# Patient Record
Sex: Male | Born: 1965 | Race: White | Hispanic: No | Marital: Married | Smoking: Never smoker
Health system: Southern US, Community
[De-identification: ages and names within clinical notes are randomized; demographics above are authoritative.]

## PROBLEM LIST (undated history)

## (undated) ENCOUNTER — Emergency Department (HOSPITAL_COMMUNITY): Admission: EM | Payer: Self-pay | Source: Home / Self Care

## (undated) DIAGNOSIS — T8859XA Other complications of anesthesia, initial encounter: Secondary | ICD-10-CM

## (undated) DIAGNOSIS — K649 Unspecified hemorrhoids: Secondary | ICD-10-CM

## (undated) DIAGNOSIS — T4145XA Adverse effect of unspecified anesthetic, initial encounter: Secondary | ICD-10-CM

## (undated) DIAGNOSIS — R112 Nausea with vomiting, unspecified: Secondary | ICD-10-CM

## (undated) DIAGNOSIS — Z9889 Other specified postprocedural states: Secondary | ICD-10-CM

## (undated) DIAGNOSIS — Z8719 Personal history of other diseases of the digestive system: Secondary | ICD-10-CM

## (undated) HISTORY — DX: Unspecified hemorrhoids: K64.9

## (undated) HISTORY — PX: OTHER SURGICAL HISTORY: SHX169

## (undated) HISTORY — PX: CARPAL TUNNEL RELEASE: SHX101

## (undated) HISTORY — PX: TONSILLECTOMY: SUR1361

## (undated) HISTORY — DX: Personal history of other diseases of the digestive system: Z87.19

## (undated) HISTORY — PX: APPENDECTOMY: SHX54

---

## 1999-12-20 ENCOUNTER — Ambulatory Visit (HOSPITAL_COMMUNITY): Admission: RE | Admit: 1999-12-20 | Discharge: 1999-12-20 | Payer: Self-pay | Admitting: Orthopedic Surgery

## 1999-12-20 ENCOUNTER — Encounter: Payer: Self-pay | Admitting: Orthopedic Surgery

## 2000-12-18 ENCOUNTER — Ambulatory Visit (HOSPITAL_BASED_OUTPATIENT_CLINIC_OR_DEPARTMENT_OTHER): Admission: RE | Admit: 2000-12-18 | Discharge: 2000-12-18 | Payer: Self-pay | Admitting: General Surgery

## 2002-03-06 ENCOUNTER — Ambulatory Visit (HOSPITAL_COMMUNITY): Admission: RE | Admit: 2002-03-06 | Discharge: 2002-03-06 | Payer: Self-pay | Admitting: Orthopedic Surgery

## 2002-03-06 ENCOUNTER — Encounter: Payer: Self-pay | Admitting: Orthopedic Surgery

## 2003-07-19 ENCOUNTER — Ambulatory Visit (HOSPITAL_COMMUNITY): Admission: RE | Admit: 2003-07-19 | Discharge: 2003-07-19 | Payer: Self-pay | Admitting: Orthopedic Surgery

## 2004-05-03 ENCOUNTER — Emergency Department: Payer: Self-pay | Admitting: Internal Medicine

## 2004-05-22 ENCOUNTER — Ambulatory Visit (HOSPITAL_COMMUNITY): Admission: RE | Admit: 2004-05-22 | Discharge: 2004-05-22 | Payer: Self-pay | Admitting: Orthopedic Surgery

## 2006-03-10 ENCOUNTER — Emergency Department (HOSPITAL_COMMUNITY): Admission: EM | Admit: 2006-03-10 | Discharge: 2006-03-10 | Payer: Self-pay | Admitting: Emergency Medicine

## 2006-03-13 ENCOUNTER — Ambulatory Visit (HOSPITAL_BASED_OUTPATIENT_CLINIC_OR_DEPARTMENT_OTHER): Admission: RE | Admit: 2006-03-13 | Discharge: 2006-03-13 | Payer: Self-pay | Admitting: Orthopedic Surgery

## 2006-10-06 ENCOUNTER — Emergency Department (HOSPITAL_COMMUNITY): Admission: EM | Admit: 2006-10-06 | Discharge: 2006-10-07 | Payer: Self-pay | Admitting: Emergency Medicine

## 2010-11-22 ENCOUNTER — Other Ambulatory Visit (HOSPITAL_COMMUNITY): Payer: Self-pay | Admitting: Orthopedic Surgery

## 2010-11-22 ENCOUNTER — Ambulatory Visit (HOSPITAL_COMMUNITY)
Admission: RE | Admit: 2010-11-22 | Discharge: 2010-11-22 | Disposition: A | Discharge status: Home / Self Care | Payer: BC Managed Care – PPO | Source: Ambulatory Visit | Attending: Orthopedic Surgery | Admitting: Orthopedic Surgery

## 2010-11-22 DIAGNOSIS — Z0389 Encounter for observation for other suspected diseases and conditions ruled out: Secondary | ICD-10-CM | POA: Insufficient documentation

## 2010-11-22 DIAGNOSIS — Z1389 Encounter for screening for other disorder: Secondary | ICD-10-CM

## 2011-07-22 HISTORY — PX: WRIST SURGERY: SHX841

## 2014-09-25 ENCOUNTER — Other Ambulatory Visit (HOSPITAL_COMMUNITY): Payer: Self-pay | Admitting: Orthopedic Surgery

## 2014-09-25 DIAGNOSIS — M25522 Pain in left elbow: Secondary | ICD-10-CM

## 2014-09-26 ENCOUNTER — Ambulatory Visit (HOSPITAL_COMMUNITY): Admission: RE | Admit: 2014-09-26 | Payer: Commercial Managed Care - PPO | Source: Ambulatory Visit

## 2015-07-22 HISTORY — PX: WRIST SURGERY: SHX841

## 2016-05-13 ENCOUNTER — Encounter (HOSPITAL_COMMUNITY): Payer: Self-pay | Admitting: Emergency Medicine

## 2016-05-13 ENCOUNTER — Emergency Department (HOSPITAL_COMMUNITY)
Admission: EM | Admit: 2016-05-13 | Discharge: 2016-05-13 | Disposition: A | Discharge status: Home / Self Care | Payer: Worker's Compensation | Attending: Emergency Medicine | Admitting: Emergency Medicine

## 2016-05-13 ENCOUNTER — Emergency Department (HOSPITAL_COMMUNITY): Payer: Worker's Compensation

## 2016-05-13 DIAGNOSIS — S52501A Unspecified fracture of the lower end of right radius, initial encounter for closed fracture: Secondary | ICD-10-CM | POA: Diagnosis not present

## 2016-05-13 DIAGNOSIS — S52614A Nondisplaced fracture of right ulna styloid process, initial encounter for closed fracture: Secondary | ICD-10-CM | POA: Diagnosis not present

## 2016-05-13 DIAGNOSIS — Y939 Activity, unspecified: Secondary | ICD-10-CM | POA: Diagnosis not present

## 2016-05-13 DIAGNOSIS — S59911A Unspecified injury of right forearm, initial encounter: Secondary | ICD-10-CM | POA: Diagnosis present

## 2016-05-13 DIAGNOSIS — Y929 Unspecified place or not applicable: Secondary | ICD-10-CM | POA: Insufficient documentation

## 2016-05-13 DIAGNOSIS — Y990 Civilian activity done for income or pay: Secondary | ICD-10-CM | POA: Diagnosis not present

## 2016-05-13 DIAGNOSIS — W010XXA Fall on same level from slipping, tripping and stumbling without subsequent striking against object, initial encounter: Secondary | ICD-10-CM | POA: Insufficient documentation

## 2016-05-13 MED ORDER — MORPHINE SULFATE (PF) 4 MG/ML IV SOLN
4.0000 mg | Freq: Once | INTRAVENOUS | Status: AC
  Administered 2016-05-13: 4 mg via INTRAVENOUS
  Filled 2016-05-13: qty 1

## 2016-05-13 MED ORDER — OXYCODONE-ACETAMINOPHEN 5-325 MG PO TABS
2.0000 | ORAL_TABLET | Freq: Once | ORAL | Status: AC
  Administered 2016-05-13: 2 via ORAL
  Filled 2016-05-13: qty 2

## 2016-05-13 MED ORDER — OXYCODONE-ACETAMINOPHEN 5-325 MG PO TABS: 1.0000 | ORAL_TABLET | ORAL | 0 refills | Status: DC | PRN

## 2016-05-13 MED ORDER — PROPOFOL 10 MG/ML IV BOLUS: 1.0000 mg/kg | Freq: Once | INTRAVENOUS | Status: DC

## 2016-05-13 NOTE — ED Provider Notes (Signed)
[facility] DEPT Provider Note   CSN: [id number] Arrival date & time: 05/13/16  0502     History   Chief Complaint Chief Complaint  Patient presents with  . Arm Injury    HPI Adrian Mcgee is a 50 y.o. male.  HPI  History reviewed. No pertinent past medical history.  There are no active problems to display for this patient.  This a 50 year old male who presents with right wrist pain. Reports that he fell while at work and landed on his right arm. He is right-handed. Denies any tingling or weakness of the hand. Rates pain at 9 out of 10. Has not taken anything for the pain.  Past Surgical History:  Procedure Laterality Date  . APPENDECTOMY    . TONSILLECTOMY         Home Medications    Prior to Admission medications   Medication Sig Start Date End Date Taking? Authorizing Provider  OVER THE COUNTER MEDICATION Take 6 capsules by mouth daily.   Yes Historical Provider, MD  oxyCODONE-acetaminophen (PERCOCET/ROXICET) 5-325 MG tablet Take 1-2 tablets by mouth every 4 (four) hours as needed for severe pain. 05/13/16   Merryl Hacker, MD    Family History History reviewed. No pertinent family history.  Social History Social History  Substance Use Topics  . Smoking status: Never Smoker  . Smokeless tobacco: Current User    Types: Chew  . Alcohol use Yes     Comment: rare     Allergies   Review of patient's allergies indicates no known allergies.   Review of Systems Review of Systems  Constitutional: Negative.  Negative for fever.  Musculoskeletal: Negative for back pain.       Right wrist pain  Skin: Negative for wound.  All other systems reviewed and are negative.    Physical Exam Updated Vital Signs BP 170/84 (BP Location: Left Arm)   Pulse 69   Temp 97.6 F (36.4 C) (Oral)   Resp 18   Ht 5\' 9"  (1.753 m)   Wt 180 lb (81.6 kg)   SpO2 100%   BMI 26.58 kg/m   Physical Exam  Constitutional: He is oriented to person, place, and time. He  appears well-developed and well-nourished.  Cardiovascular: Normal rate and regular rhythm.   Pulmonary/Chest: Effort normal. No respiratory distress.  Musculoskeletal: He exhibits no edema.  Deformity noted right distal forearm, 2+ radial pulse, neurovascularly intact distally  Neurological: He is alert and oriented to person, place, and time.  Skin: Skin is warm and dry.  Psychiatric: He has a normal mood and affect.  Nursing note and vitals reviewed.    ED Treatments / Results  Labs (all labs ordered are listed, but only abnormal results are displayed) Labs Reviewed - No data to display  EKG  EKG Interpretation None       Radiology Dg Wrist Complete Right  Result Date: 05/13/2016 CLINICAL DATA:  Slipped and fell on LEFT floor. EXAM: RIGHT WRIST - COMPLETE 3+ VIEW COMPARISON:  None available for comparison at time of study interpretation. FINDINGS: Acute transverse fracture through the distal radial metaphysis with slight dorsal angulation of the distal bony fragments. No intra-articular extension. Nondisplaced ulnar styloid fracture. No destructive bony lesions. Dorsal wrist soft tissue swelling without subcutaneous gas or radiopaque foreign bodies. IMPRESSION: Acute mildly displaced distal radial fracture. Nondisplaced ulnar styloid fracture. No dislocation. Electronically Signed   By: Elon Alas M.D.   On: 05/13/2016 05:50    Procedures Procedures (including critical  care time)  Medications Ordered in ED Medications  morphine 4 MG/ML injection 4 mg (4 mg Intravenous Given 05/13/16 0526)  oxyCODONE-acetaminophen (PERCOCET/ROXICET) 5-325 MG per tablet 2 tablet (2 tablets Oral Given 05/13/16 0707)  morphine 4 MG/ML injection 4 mg (4 mg Intravenous Given 05/13/16 0707)     Initial Impression / Assessment and Plan / ED Course  I have reviewed the triage vital signs and the nursing notes.  Pertinent labs & imaging results that were available during my care of the  patient were reviewed by me and considered in my medical decision making (see chart for details).  Clinical Course    Patient presents with right arm injury. Otherwise nontoxic and neurovascular intact. Minimally displaced distal radius fracture as well as ulnar styloid fracture. Patient placed in a sugar tong splint. Follow-up with hand surgery.  After history, exam, and medical workup I feel the patient has been appropriately medically screened and is safe for discharge home. Pertinent diagnoses were discussed with the patient. Patient was given return precautions.   Final Clinical Impressions(s) / ED Diagnoses   Final diagnoses:  Closed fracture of distal end of right radius, unspecified fracture morphology, initial encounter  Closed nondisplaced fracture of styloid process of right ulna, initial encounter    New Prescriptions New Prescriptions   OXYCODONE-ACETAMINOPHEN (PERCOCET/ROXICET) 5-325 MG TABLET    Take 1-2 tablets by mouth every 4 (four) hours as needed for severe pain.     Merryl Hacker, MD 05/13/16 [phone]

## 2016-05-13 NOTE — Discharge Instructions (Signed)
You were seen today for arm pain. You have a fracture of the radius and ulna in the right arm. Maintain splint until follow-up with orthopedist. Follow-up in one week with orthopedist of her choice. You were given follow-up information for Dr. Lenon Curt as he is the hand surgeon on call.

## 2016-05-13 NOTE — ED Triage Notes (Signed)
Pt states he was trying to move a floor scrubber and his feet slipped out from under him and he fell landing on his right arm

## 2016-05-13 NOTE — ED Notes (Signed)
Family at bedside. 

## 2016-05-13 NOTE — ED Notes (Signed)
Pt. returned from XR. 

## 2016-05-13 NOTE — ED Notes (Signed)
Dr. Horton at bedside. 

## 2016-05-13 NOTE — ED Notes (Signed)
Report given to Crystal, RN.

## 2016-05-13 NOTE — ED Notes (Signed)
Ortho tech at bedside to place splint.

## 2016-05-13 NOTE — ED Notes (Signed)
Pt to XR

## 2016-12-03 DIAGNOSIS — G5601 Carpal tunnel syndrome, right upper limb: Secondary | ICD-10-CM | POA: Insufficient documentation

## 2016-12-03 DIAGNOSIS — M65341 Trigger finger, right ring finger: Secondary | ICD-10-CM | POA: Insufficient documentation

## 2016-12-03 DIAGNOSIS — S52501A Unspecified fracture of the lower end of right radius, initial encounter for closed fracture: Secondary | ICD-10-CM | POA: Insufficient documentation

## 2017-03-20 ENCOUNTER — Other Ambulatory Visit: Payer: Self-pay | Admitting: Orthopedic Surgery

## 2017-03-25 ENCOUNTER — Encounter (HOSPITAL_BASED_OUTPATIENT_CLINIC_OR_DEPARTMENT_OTHER): Payer: Self-pay | Admitting: *Deleted

## 2017-03-31 ENCOUNTER — Encounter (HOSPITAL_BASED_OUTPATIENT_CLINIC_OR_DEPARTMENT_OTHER): Payer: Self-pay | Admitting: Anesthesiology

## 2017-03-31 ENCOUNTER — Ambulatory Visit (HOSPITAL_BASED_OUTPATIENT_CLINIC_OR_DEPARTMENT_OTHER)
Admission: RE | Admit: 2017-03-31 | Discharge: 2017-03-31 | Disposition: A | Discharge status: Home / Self Care | Payer: Worker's Compensation | Source: Ambulatory Visit | Attending: Orthopedic Surgery | Admitting: Orthopedic Surgery

## 2017-03-31 ENCOUNTER — Ambulatory Visit (HOSPITAL_BASED_OUTPATIENT_CLINIC_OR_DEPARTMENT_OTHER): Payer: Worker's Compensation | Admitting: Anesthesiology

## 2017-03-31 ENCOUNTER — Encounter (HOSPITAL_BASED_OUTPATIENT_CLINIC_OR_DEPARTMENT_OTHER)
Admission: RE | Disposition: A | Discharge status: Home / Self Care | Payer: Self-pay | Source: Ambulatory Visit | Attending: Orthopedic Surgery

## 2017-03-31 DIAGNOSIS — M65331 Trigger finger, right middle finger: Secondary | ICD-10-CM | POA: Diagnosis present

## 2017-03-31 DIAGNOSIS — Z720 Tobacco use: Secondary | ICD-10-CM | POA: Diagnosis not present

## 2017-03-31 DIAGNOSIS — M65341 Trigger finger, right ring finger: Secondary | ICD-10-CM | POA: Insufficient documentation

## 2017-03-31 HISTORY — PX: TRIGGER FINGER RELEASE: SHX641

## 2017-03-31 HISTORY — DX: Nausea with vomiting, unspecified: R11.2

## 2017-03-31 HISTORY — DX: Adverse effect of unspecified anesthetic, initial encounter: T41.45XA

## 2017-03-31 HISTORY — DX: Other specified postprocedural states: Z98.890

## 2017-03-31 HISTORY — DX: Other complications of anesthesia, initial encounter: T88.59XA

## 2017-03-31 SURGERY — RELEASE, A1 PULLEY, FOR TRIGGER FINGER
Anesthesia: Monitor Anesthesia Care | Site: Hand | Laterality: Right

## 2017-03-31 MED ORDER — LIDOCAINE HCL (PF) 0.5 % IJ SOLN
INTRAMUSCULAR | Status: DC | PRN
  Administered 2017-03-31: 30 mL via INTRAVENOUS

## 2017-03-31 MED ORDER — MIDAZOLAM HCL 2 MG/2ML IJ SOLN
INTRAMUSCULAR | Status: AC
  Filled 2017-03-31: qty 2

## 2017-03-31 MED ORDER — HYDROCODONE-ACETAMINOPHEN 5-325 MG PO TABS: 1.0000 | ORAL_TABLET | Freq: Four times a day (QID) | ORAL | 0 refills | Status: AC | PRN

## 2017-03-31 MED ORDER — FENTANYL CITRATE (PF) 100 MCG/2ML IJ SOLN
50.0000 ug | INTRAMUSCULAR | Status: DC | PRN
  Administered 2017-03-31: 50 ug via INTRAVENOUS

## 2017-03-31 MED ORDER — CHLORHEXIDINE GLUCONATE 4 % EX LIQD: 60.0000 mL | Freq: Once | CUTANEOUS | Status: DC

## 2017-03-31 MED ORDER — BUPIVACAINE HCL (PF) 0.5 % IJ SOLN
INTRAMUSCULAR | Status: DC | PRN
  Administered 2017-03-31: 7 mL

## 2017-03-31 MED ORDER — PROPOFOL 500 MG/50ML IV EMUL
INTRAVENOUS | Status: DC | PRN
  Administered 2017-03-31: 75 ug/kg/min via INTRAVENOUS

## 2017-03-31 MED ORDER — CEFAZOLIN SODIUM-DEXTROSE 2-4 GM/100ML-% IV SOLN
INTRAVENOUS | Status: AC
  Filled 2017-03-31: qty 100

## 2017-03-31 MED ORDER — FENTANYL CITRATE (PF) 100 MCG/2ML IJ SOLN
INTRAMUSCULAR | Status: AC
  Filled 2017-03-31: qty 2

## 2017-03-31 MED ORDER — CEFAZOLIN SODIUM-DEXTROSE 2-4 GM/100ML-% IV SOLN
2.0000 g | INTRAVENOUS | Status: AC
  Administered 2017-03-31: 2 g via INTRAVENOUS

## 2017-03-31 MED ORDER — SCOPOLAMINE 1 MG/3DAYS TD PT72
MEDICATED_PATCH | TRANSDERMAL | Status: AC
  Filled 2017-03-31: qty 1

## 2017-03-31 MED ORDER — ONDANSETRON HCL 4 MG/2ML IJ SOLN
INTRAMUSCULAR | Status: AC
  Filled 2017-03-31: qty 2

## 2017-03-31 MED ORDER — FENTANYL CITRATE (PF) 100 MCG/2ML IJ SOLN: 25.0000 ug | INTRAMUSCULAR | Status: DC | PRN

## 2017-03-31 MED ORDER — MIDAZOLAM HCL 2 MG/2ML IJ SOLN
1.0000 mg | INTRAMUSCULAR | Status: DC | PRN
  Administered 2017-03-31: 1 mg via INTRAVENOUS

## 2017-03-31 MED ORDER — ONDANSETRON HCL 4 MG/2ML IJ SOLN: 4.0000 mg | Freq: Once | INTRAMUSCULAR | Status: DC | PRN

## 2017-03-31 MED ORDER — SCOPOLAMINE 1 MG/3DAYS TD PT72
1.0000 | MEDICATED_PATCH | Freq: Once | TRANSDERMAL | Status: DC | PRN
  Administered 2017-03-31: 1.5 mg via TRANSDERMAL

## 2017-03-31 MED ORDER — ONDANSETRON HCL 4 MG/2ML IJ SOLN
INTRAMUSCULAR | Status: DC | PRN
  Administered 2017-03-31: 4 mg via INTRAVENOUS

## 2017-03-31 MED ORDER — LACTATED RINGERS IV SOLN
INTRAVENOUS | Status: DC
  Administered 2017-03-31 (×2): via INTRAVENOUS

## 2017-03-31 SURGICAL SUPPLY — 34 items
BANDAGE COBAN STERILE 2 (GAUZE/BANDAGES/DRESSINGS) ×3 IMPLANT
BLADE SURG 15 STRL LF DISP TIS (BLADE) ×1 IMPLANT
BLADE SURG 15 STRL SS (BLADE) ×3
BNDG CMPR 9X4 STRL LF SNTH (GAUZE/BANDAGES/DRESSINGS)
BNDG ESMARK 4X9 LF (GAUZE/BANDAGES/DRESSINGS) IMPLANT
CHLORAPREP W/TINT 26ML (MISCELLANEOUS) ×3 IMPLANT
CORD BIPOLAR FORCEPS 12FT (ELECTRODE) IMPLANT
COVER BACK TABLE 60X90IN (DRAPES) ×3 IMPLANT
COVER MAYO STAND STRL (DRAPES) ×3 IMPLANT
CUFF TOURNIQUET SINGLE 18IN (TOURNIQUET CUFF) ×2 IMPLANT
DECANTER SPIKE VIAL GLASS SM (MISCELLANEOUS) IMPLANT
DRAPE EXTREMITY T 121X128X90 (DRAPE) ×3 IMPLANT
DRAPE SURG 17X23 STRL (DRAPES) ×3 IMPLANT
GAUZE SPONGE 4X4 12PLY STRL (GAUZE/BANDAGES/DRESSINGS) ×3 IMPLANT
GAUZE XEROFORM 1X8 LF (GAUZE/BANDAGES/DRESSINGS) ×3 IMPLANT
GLOVE BIOGEL PI IND STRL 7.0 (GLOVE) IMPLANT
GLOVE BIOGEL PI IND STRL 8.5 (GLOVE) ×1 IMPLANT
GLOVE BIOGEL PI INDICATOR 7.0 (GLOVE) ×4
GLOVE BIOGEL PI INDICATOR 8.5 (GLOVE) ×2
GLOVE ECLIPSE 6.5 STRL STRAW (GLOVE) ×2 IMPLANT
GLOVE SURG ORTHO 8.0 STRL STRW (GLOVE) ×3 IMPLANT
GOWN STRL REUS W/ TWL LRG LVL3 (GOWN DISPOSABLE) ×1 IMPLANT
GOWN STRL REUS W/TWL LRG LVL3 (GOWN DISPOSABLE) ×3
GOWN STRL REUS W/TWL XL LVL3 (GOWN DISPOSABLE) ×3 IMPLANT
NDL PRECISIONGLIDE 27X1.5 (NEEDLE) ×1 IMPLANT
NEEDLE PRECISIONGLIDE 27X1.5 (NEEDLE) ×3 IMPLANT
NS IRRIG 1000ML POUR BTL (IV SOLUTION) ×3 IMPLANT
PACK BASIN DAY SURGERY FS (CUSTOM PROCEDURE TRAY) ×3 IMPLANT
STOCKINETTE 4X48 STRL (DRAPES) ×3 IMPLANT
SUT ETHILON 4 0 PS 2 18 (SUTURE) ×3 IMPLANT
SYR BULB 3OZ (MISCELLANEOUS) ×3 IMPLANT
SYR CONTROL 10ML LL (SYRINGE) ×3 IMPLANT
TOWEL OR 17X24 6PK STRL BLUE (TOWEL DISPOSABLE) ×4 IMPLANT
UNDERPAD 30X30 (UNDERPADS AND DIAPERS) ×3 IMPLANT

## 2017-03-31 NOTE — Op Note (Signed)
Dictation Number [id number]

## 2017-03-31 NOTE — Anesthesia Preprocedure Evaluation (Addendum)
Anesthesia Evaluation  Patient identified by MRN, date of birth, ID band Patient awake    Reviewed: Allergy & Precautions, NPO status , Patient's Chart, lab work & pertinent test results  History of Anesthesia Complications (+) PONV and history of anesthetic complications  Airway Mallampati: II  TM Distance: >3 FB Neck ROM: Full    Dental no notable dental hx.    Pulmonary neg pulmonary ROS,    Pulmonary exam normal breath sounds clear to auscultation       Cardiovascular negative cardio ROS Normal cardiovascular exam Rhythm:Regular Rate:Normal     Neuro/Psych negative neurological ROS  negative psych ROS   GI/Hepatic negative GI ROS, Neg liver ROS,   Endo/Other  negative endocrine ROS  Renal/GU negative Renal ROS     Musculoskeletal negative musculoskeletal ROS (+)   Abdominal   Peds  Hematology negative hematology ROS (+)   Anesthesia Other Findings   Reproductive/Obstetrics                             Anesthesia Physical Anesthesia Plan  ASA: I  Anesthesia Plan: MAC and Bier Block   Post-op Pain Management:    Induction: Intravenous  PONV Risk Score and Plan: 2 and Propofol infusion, Treatment Matthews vary due to age or medical condition, Ondansetron and Dexamethasone  Airway Management Planned: Natural Airway  Additional Equipment:   Intra-op Plan:   Post-operative Plan:   Informed Consent: I have reviewed the patients History and Physical, chart, labs and discussed the procedure including the risks, benefits and alternatives for the proposed anesthesia with the patient or authorized representative who has indicated his/her understanding and acceptance.   Dental advisory given  Plan Discussed with: CRNA  Anesthesia Plan Comments:        Anesthesia Quick Evaluation

## 2017-03-31 NOTE — Discharge Instructions (Addendum)
[facility] Instructions °Hand Surgery ° °Wound Care: °Keep your hand elevated above the level of your heart.  Do not allow it to dangle by your side.  Keep the dressing dry and do not remove it unless your doctor advises you to do so.  He will usually change it at the time of your post-op visit.  Moving your fingers is advised to stimulate circulation but will depend on the site of your surgery.  If you have a splint applied, your doctor will advise you regarding movement. ° °Activity: °Do not drive or operate machinery today.  Rest today and then you Ridlon return to your normal activity and work as indicated by your physician. ° °Diet:  °Drink liquids today or eat a light diet.  You Lesko resume a regular diet tomorrow.   ° °General expectations: °Pain for two to three days. °Fingers Sky become slightly swollen. ° °Call your doctor if any of the following occur: °Severe pain not relieved by pain medication. °Elevated temperature. °Dressing soaked with blood. °Inability to move fingers. °White or bluish color to fingers. ° ° °Post Anesthesia Home Care Instructions ° °Activity: °Get plenty of rest for the remainder of the day. A responsible individual must stay with you for 24 hours following the procedure.  °For the next 24 hours, DO NOT: °-Drive a car °-Operate machinery °-Drink alcoholic beverages °-Take any medication unless instructed by your physician °-Make any legal decisions or sign important papers. ° °Meals: °Start with liquid foods such as gelatin or soup. Progress to regular foods as tolerated. Avoid greasy, spicy, heavy foods. If nausea and/or vomiting occur, drink only clear liquids until the nausea and/or vomiting subsides. Call your physician if vomiting continues. ° °Special Instructions/Symptoms: °Your throat Pascuzzi feel dry or sore from the anesthesia or the breathing tube placed in your throat during surgery. If this causes discomfort, gargle with warm salt water. The discomfort should disappear within  24 hours. ° °If you had a scopolamine patch placed behind your ear for the management of post- operative nausea and/or vomiting: ° °1. The medication in the patch is effective for 72 hours, after which it should be removed.  Wrap patch in a tissue and discard in the trash. Wash hands thoroughly with soap and water. °2. You Lampton remove the patch earlier than 72 hours if you experience unpleasant side effects which Dennard include dry mouth, dizziness or visual disturbances. °3. Avoid touching the patch. Wash your hands with soap and water after contact with the patch. °  ° ° °

## 2017-03-31 NOTE — Transfer of Care (Signed)
Immediate Anesthesia Transfer of Care Note  Patient: Adrian Mcgee  Procedure(s) Performed: Procedure(s) with comments: RELEASE TRIGGER FINGER/A-1 PULLEY RIGHT MIDDLE FINGER, RIGHT RINGER FINGER (Right) - REG/FAB   Patient Location: PACU  Anesthesia Type:Bier block  Level of Consciousness: awake, alert  and oriented  Airway & Oxygen Therapy: Patient Spontanous Breathing  Post-op Assessment: Report given to RN and Post -op Vital signs reviewed and stable  Post vital signs: Reviewed and stable  Last Vitals:  Vitals:   03/31/17 1135  BP: 113/81  Pulse: 61  Resp: 18  Temp: 36.9 C  SpO2: 99%    Last Pain:  Vitals:   03/31/17 1135  TempSrc: Oral  PainSc: 1       Patients Stated Pain Goal: 1 (90/38/33 3832)  Complications: No apparent anesthesia complications

## 2017-03-31 NOTE — Brief Op Note (Signed)
03/31/2017  12:40 PM  PATIENT:  Adrian Mcgee  51 y.o. male  PRE-OPERATIVE DIAGNOSIS:  TRIGGER RIGHT MIDDLE FINGER,TRIFFTER RIGHT RING FINGER  POST-OPERATIVE DIAGNOSIS:  TRIGGER RIGHT MIDDLE FINGER,TRIGGER RIGHT RING FINGER  PROCEDURE:  Procedure(s) with comments: RELEASE TRIGGER FINGER/A-1 PULLEY RIGHT MIDDLE FINGER, RIGHT RINGER FINGER (Right) - REG/FAB  SURGEON:  Surgeon(s) and Role:    Daryll Brod, MD - Primary  PHYSICIAN ASSISTANT:   ASSISTANTS: none   ANESTHESIA:   local and regional  EBL:  No intake/output data recorded.  BLOOD ADMINISTERED:none  DRAINS: none   LOCAL MEDICATIONS USED:  BUPIVICAINE   SPECIMEN:  No Specimen  DISPOSITION OF SPECIMEN:  N/A  COUNTS:  YES  TOURNIQUET:   Total Tourniquet Time Documented: Forearm (Right) - 20 minutes Total: Forearm (Right) - 20 minutes   DICTATION: .Other Dictation: Dictation Number [id number]  PLAN OF CARE: Discharge to home after PACU  PATIENT DISPOSITION:  PACU - hemodynamically stable.

## 2017-03-31 NOTE — H&P (Signed)
  Adrian Mcgee is an 51 y.o. male.   Chief Complaint: catching middle and ring right HPI: Adrian Mcgee is a 51 year old right-hand-dominant male referred by Dr. Fredonia Highland for consultation regarding pain in his right hand. He sustained a fracture of his distal radius in October 2017 and underwent open reduction internal fixation with a volar plate carpal tunnel release at that time for carpal tunnel syndrome symptoms. He has done well except that he continues to complain of decreased grip and popping in his fingers middle ring and small. he states full flexion causes pain for him. He describes this as an aching occasional sharp pain with a VAS score of 7-8/10. He has been taking nonsteroidal anti-inflammatories for this. He has a history of other fractures to that wrist and opposite side when treated by Dr. Daylene Katayama. He has also had an injury to his neck. He has no history diabetes thyroid problems arthritis gout. Family history is negative for each of these also. He has been undergoing therapy for his wrist. Which he states is doing well. He is complaining of discomfort in his fingers primarily at the metacarpal phalangeal joints middle ring and small.He states injections have  given him temporary relief for a day or 2 and then the pain recurs. Is felt to have trigger fingers on each of those 2 digits.          Past Medical History:  Diagnosis Date  . Complication of anesthesia   . PONV (postoperative nausea and vomiting)     Past Surgical History:  Procedure Laterality Date  . APPENDECTOMY    . TONSILLECTOMY    . WRIST SURGERY Right 2017  . WRIST SURGERY Left 2013    History reviewed. No pertinent family history. Social History:  reports that he has never smoked. His smokeless tobacco use includes Chew. He reports that he drinks alcohol. He reports that he does not use drugs.  Allergies: No Known Allergies  No prescriptions prior to admission.    No results found for this or any previous  visit (from the past 48 hour(s)).  No results found.   Pertinent items are noted in HPI.  Height 5\' 9"  (1.753 m), weight 81.6 kg (180 lb).  General appearance: alert, cooperative and appears stated age Head: Normocephalic, without obvious abnormality Neck: no JVD Resp: clear to auscultation bilaterally Cardio: regular rate and rhythm, S1, S2 normal, no murmur, click, rub or gallop GI: soft, non-tender; bowel sounds normal; no masses,  no organomegaly Extremities: extremities normal, atraumatic, no cyanosis or edema and catching right middle and ring fingers Pulses: 2+ and symmetric Skin: Skin color, texture, turgor normal. No rashes or lesions Neurologic: Grossly normal Incision/Wound: na  Assessment/Plan  Assessment:  1. Trigger middle finger of right hand  2. Trigger finger, right ring finger    Plan: Like to proceed to surgical release. Pre-peri-and postoperative course are discussed along with risks and complications. He is aware there is no guarantee to the surgery the possibility of infection recurrence injury to arteries nerves tendons and incomplete relief of symptoms. Question are encouraged and answered to his satisfaction. He will be placed at one-handed work during this period of time. We will have him at out of work for short period of time following surgery. He would like to proceed.      Adrian Mcgee R 03/31/2017, 10:39 AM

## 2017-03-31 NOTE — Anesthesia Postprocedure Evaluation (Signed)
Anesthesia Post Note  Patient: Adrian Mcgee  Procedure(s) Performed: Procedure(s) (LRB): RELEASE TRIGGER FINGER/A-1 PULLEY RIGHT MIDDLE FINGER, RIGHT RINGER FINGER (Right)     Patient location during evaluation: PACU Anesthesia Type: Bier Block Level of consciousness: awake and alert Pain management: pain level controlled Vital Signs Assessment: post-procedure vital signs reviewed and stable Respiratory status: spontaneous breathing, nonlabored ventilation, respiratory function stable and patient connected to nasal cannula oxygen Cardiovascular status: stable and blood pressure returned to baseline Anesthetic complications: no    Last Vitals:  Vitals:   03/31/17 1308 03/31/17 1323  BP:  117/82  Pulse: (!) 51 (!) 48  Resp: 10 16  Temp:  36.4 C  SpO2: 99% 100%    Last Pain:  Vitals:   03/31/17 1323  TempSrc: Oral  PainSc: 0-No pain                 Adrian Hunger P Urvi Mcgee

## 2017-04-01 ENCOUNTER — Encounter (HOSPITAL_BASED_OUTPATIENT_CLINIC_OR_DEPARTMENT_OTHER): Payer: Self-pay | Admitting: Orthopedic Surgery

## 2017-04-01 NOTE — Op Note (Signed)
NAMELENOX, BINK NO.:  [id number]  MEDICAL RECORD NO.:  [id number]  LOCATION:                                 FACILITY:  PHYSICIAN:  Daryll Brod, M.D.            DATE OF BIRTH:  DATE OF PROCEDURE:  03/31/2017 DATE OF DISCHARGE:                              OPERATIVE REPORT   PREOPERATIVE DIAGNOSIS:  Stenosing tenosynovitis, right middle and right ring fingers.  POSTOPERATIVE DIAGNOSIS:  Stenosing tenosynovitis, right middle and right ring fingers.  OPERATION:  Release of A1 pulley, right middle and right ring fingers.  SURGEON:  Daryll Brod, M.D.  ASSISTANT:  None.  ANESTHESIA:  Forearm-based IV regional with local infiltration.  PLACE OF SURGERY:  [facility].  ANESTHESIOLOGIST:  [place].  HISTORY:  The patient is a 51 year old male with a history of distal radius fracture, undergoing open reduction and internal fixation.  Has developed triggering of his middle ring fingers.  This has not responded to conservative treatment including injections, finding only temporary relief with the injections.  He is admitted for release of the A1 pulley of the right middle and right ring fingers.  Pre, peri, and postoperative course have been discussed along with risks and complications.  He is aware that there is no guarantee to the surgery, the possibility of infection; recurrence of injury to arteries/nerves/tendons, incomplete relief of symptoms, and dystrophy. In the preoperative area, the patient is seen, the extremity marked by both patient and surgeon, antibiotic given.  DESCRIPTION OF PROCEDURE:  The patient was brought to the operating room where a forearm-based IV regional anesthetic was carried out without difficulty under the direction of the Anesthesia Department.  He was prepped using ChloraPrep in supine position with the right arm free.  A 3-minute dry time was allowed.  Time-out taken confirming the patient and procedure.  After  adequate anesthesia was afforded, an oblique incision was made over the A1 pulley of the right middle finger, carried down through subcutaneous tissue.  Bleeders were electrocauterized with bipolar.  The neurovascular bundles radially and ulnarly were protected with retractors.  The A1 pulley was released on its radial aspect. There was significant scarring about it and slightly more proximally. This was released proximally and on the radial aspect of the A1 pulley with a small incision made centrally in A2.  The 2 tendons were then separated to break any adhesions in the tenosynovial tissue.  The finger placed through a full range of motion and no further triggering was noted.  A separate incision was then made over the ring finger of the right hand.  Carried down through subcutaneous tissue.  Bleeders were again electrocauterized with bipolar as necessary.  The A1 pulley was isolated.  Retractors were placed protecting neurovascular bundles radially and ulnarly.  The A1 pulley was released on its radial aspect with a small incision made centrally in A2 and dissection carried proximally transecting the palmar pulley proximally.  The 2 tendons were then separated to break any adhesions.  Again, significant scarring was noted across the palm.  The finger placed through a full range of  motion, showed no further triggering.  The wounds were each irrigated and closed with interrupted 4-0 nylon sutures.  A local infiltration with 0.25% bupivacaine without epinephrine was given, approximately 7 mL total was injected.  A sterile compressive dressing with the fingers free was applied.  On deflation of the tourniquet, all fingers immediately pinked.  He was taken to the recovery room for observation in satisfactory condition.  He will be discharged to home to return to the [facility] in 1 week, on [place].          ______________________________ Daryll Brod, M.D.     GK/MEDQ   D:  03/31/2017  T:  04/01/2017  Job:  [id number]

## 2017-07-24 DIAGNOSIS — R52 Pain, unspecified: Secondary | ICD-10-CM | POA: Insufficient documentation

## 2017-07-24 DIAGNOSIS — M542 Cervicalgia: Secondary | ICD-10-CM | POA: Insufficient documentation

## 2017-08-07 ENCOUNTER — Ambulatory Visit: Payer: Self-pay | Admitting: Internal Medicine

## 2017-08-21 ENCOUNTER — Ambulatory Visit: Payer: Self-pay | Admitting: Primary Care

## 2017-11-03 DIAGNOSIS — M79641 Pain in right hand: Secondary | ICD-10-CM | POA: Insufficient documentation

## 2017-11-06 DIAGNOSIS — M5412 Radiculopathy, cervical region: Secondary | ICD-10-CM | POA: Insufficient documentation

## 2017-11-06 DIAGNOSIS — M5030 Other cervical disc degeneration, unspecified cervical region: Secondary | ICD-10-CM | POA: Insufficient documentation

## 2018-02-25 ENCOUNTER — Encounter: Payer: Self-pay | Admitting: Gastroenterology

## 2018-04-20 ENCOUNTER — Ambulatory Visit (AMBULATORY_SURGERY_CENTER): Payer: Self-pay | Admitting: *Deleted

## 2018-04-20 ENCOUNTER — Encounter: Payer: Self-pay | Admitting: Gastroenterology

## 2018-04-20 VITALS — Ht 68.5 in | Wt 186.0 lb

## 2018-04-20 DIAGNOSIS — Z1211 Encounter for screening for malignant neoplasm of colon: Secondary | ICD-10-CM

## 2018-04-20 MED ORDER — PEG 3350-KCL-NA BICARB-NACL 420 G PO SOLR: 4000.0000 mL | Freq: Once | ORAL | 0 refills | Status: AC

## 2018-04-20 NOTE — Progress Notes (Signed)
No egg or soy allergy known to patient  No issues with past sedation with any surgeries  or procedures, no intubation problems  No diet pills per patient No home 02 use per patient  No blood thinners per patient  Pt denies issues with constipation  No A fib or A flutter  EMMI video sent to pt's e mail pt declined   

## 2018-04-21 ENCOUNTER — Telehealth: Payer: Self-pay | Admitting: Gastroenterology

## 2018-04-21 NOTE — Telephone Encounter (Signed)
The pt states he has a large hemorrhoid and wants to reschedule his colon.  I have rescheduled and re instructed.  The pt has been advised of the information and verbalized understanding.

## 2018-04-25 IMAGING — CR DG WRIST COMPLETE 3+V*R*
3 series · 3 of 3 positions shown · non-contrast
Comparison: None available for comparison at time of study
interpretation.

CLINICAL DATA: Slipped and fell on LEFT floor.

EXAM:
RIGHT WRIST - COMPLETE 3+ VIEW

[x wrist pa right]
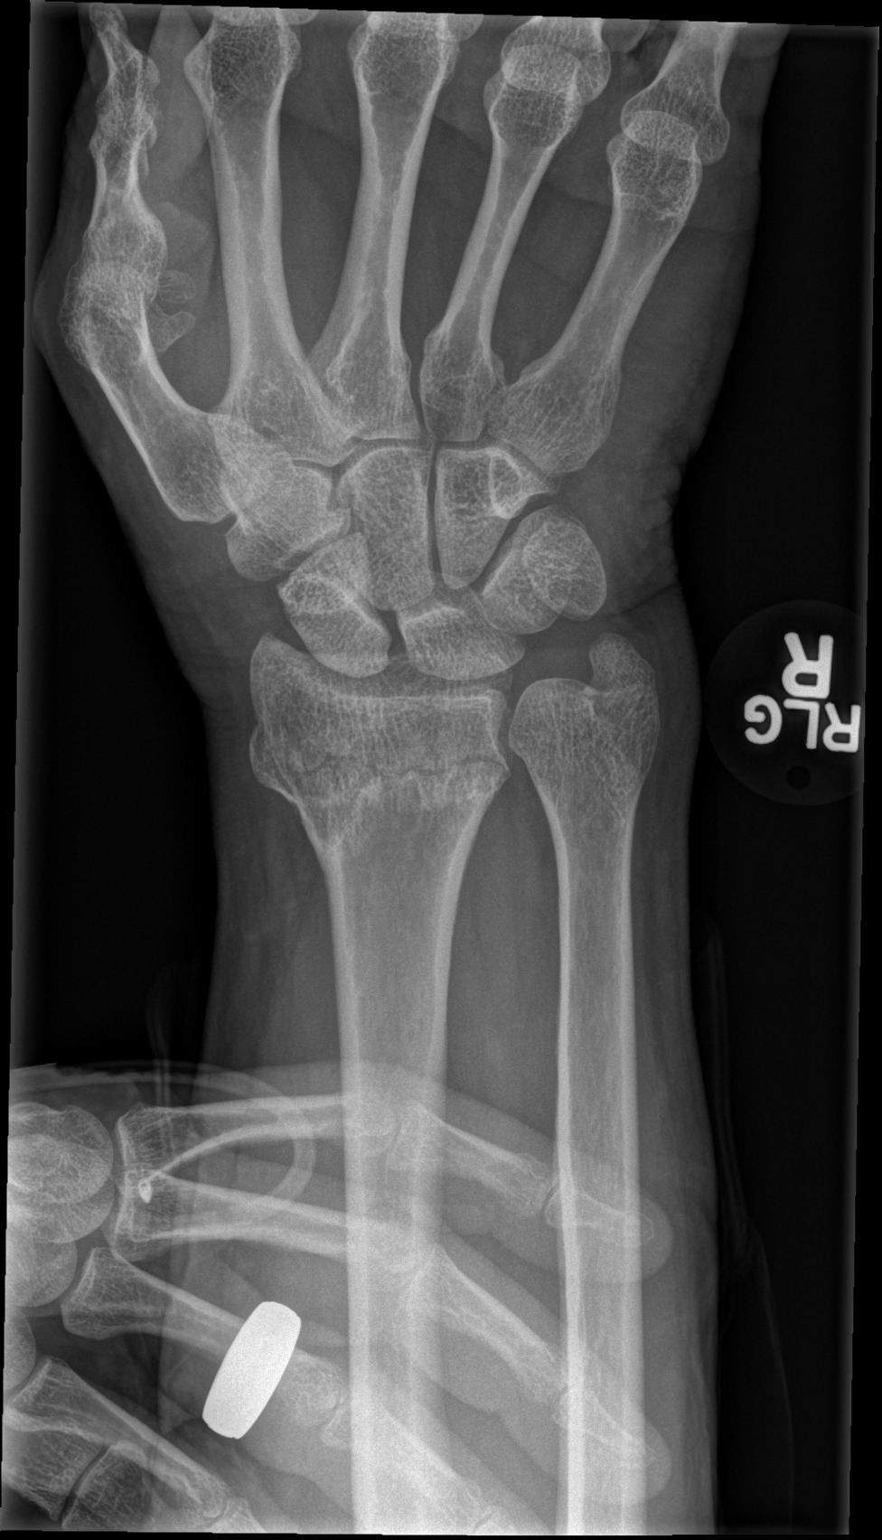

[x wrist obl right]
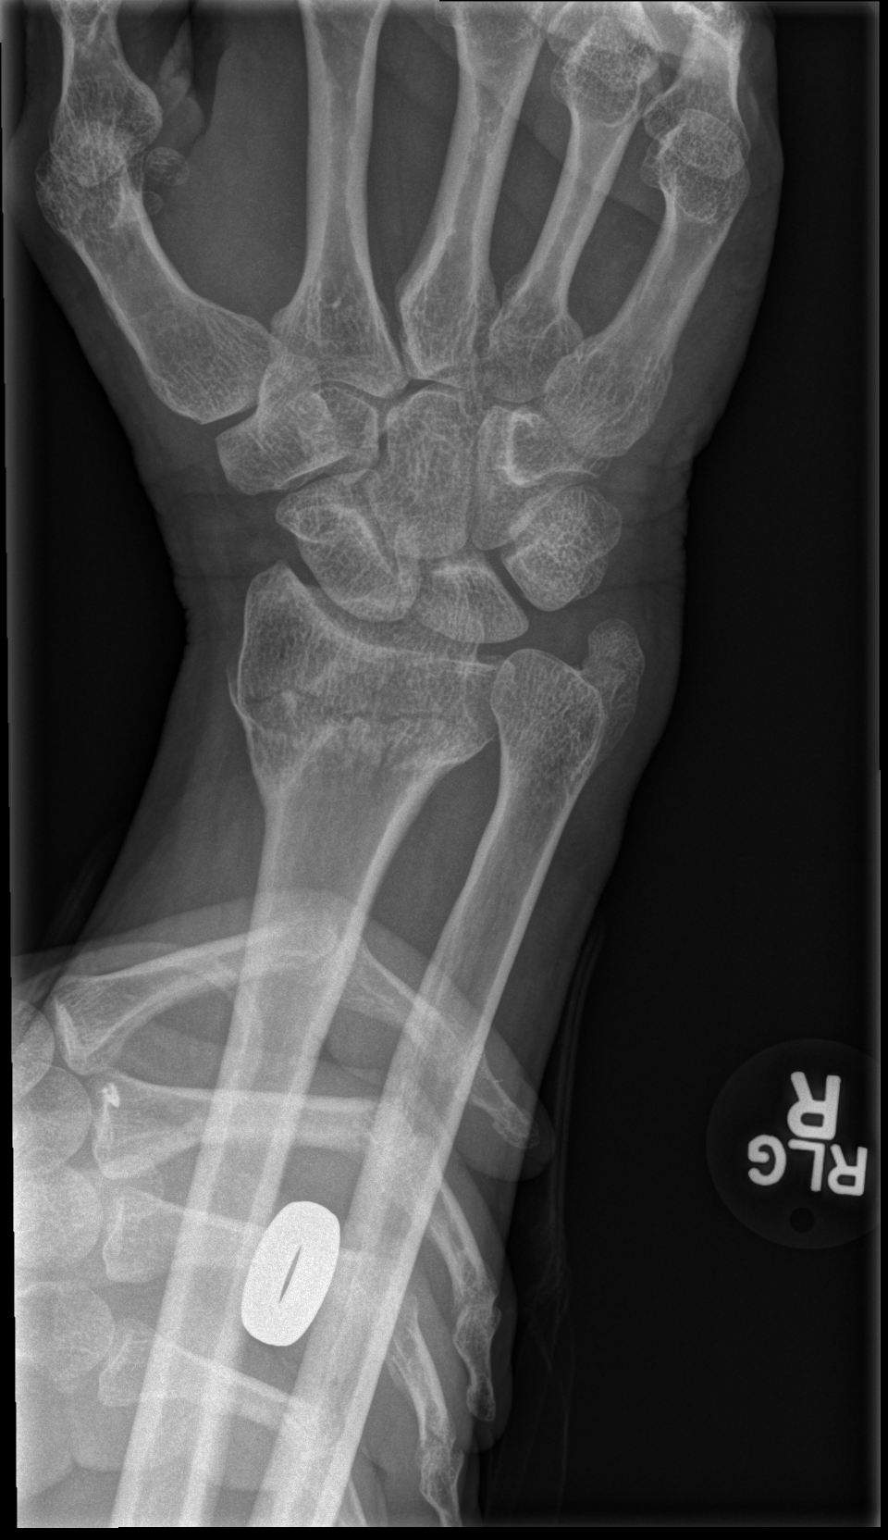

[x wrist lat right]
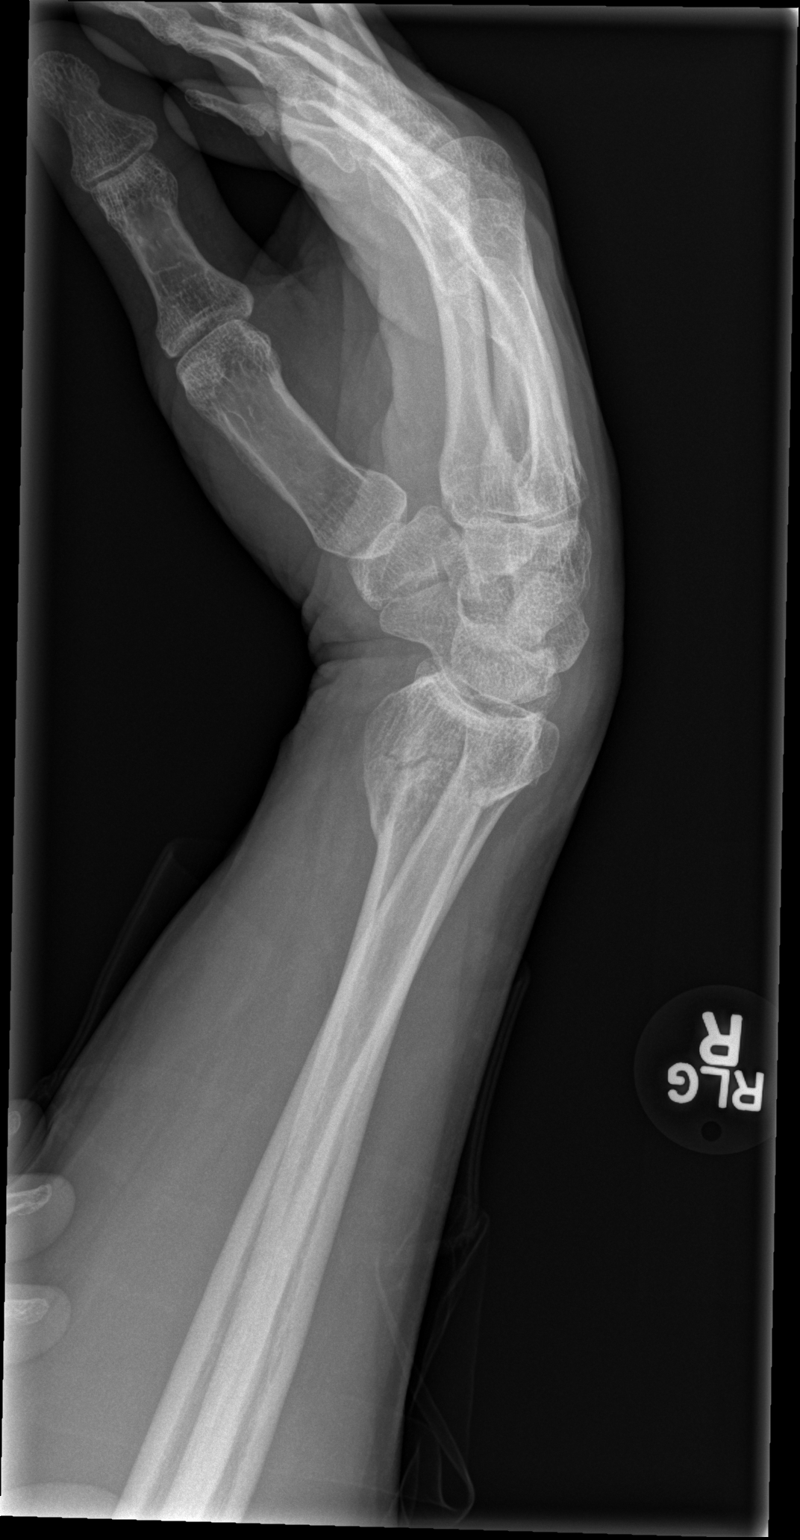

[3 of 3 positions shown; findings below may reference images not displayed]

FINDINGS: Acute transverse fracture through the distal radial metaphysis with
slight dorsal angulation of the distal bony fragments. No
intra-articular extension. Nondisplaced ulnar styloid fracture. No
destructive bony lesions. Dorsal wrist soft tissue swelling without
subcutaneous gas or radiopaque foreign bodies.
IMPRESSION: Acute mildly displaced distal radial fracture. Nondisplaced ulnar
styloid fracture.

No dislocation.

## 2018-04-27 ENCOUNTER — Encounter: Payer: Self-pay | Admitting: Gastroenterology

## 2018-05-24 ENCOUNTER — Encounter: Payer: Self-pay | Admitting: *Deleted

## 2018-05-26 ENCOUNTER — Encounter: Payer: Self-pay | Admitting: Gastroenterology

## 2018-05-26 ENCOUNTER — Ambulatory Visit (AMBULATORY_SURGERY_CENTER): Payer: 59 | Admitting: Gastroenterology

## 2018-05-26 VITALS — BP 111/60 | HR 54 | Temp 98.0°F | Resp 19 | Ht 68.0 in | Wt 186.0 lb

## 2018-05-26 DIAGNOSIS — D122 Benign neoplasm of ascending colon: Secondary | ICD-10-CM

## 2018-05-26 DIAGNOSIS — D120 Benign neoplasm of cecum: Secondary | ICD-10-CM

## 2018-05-26 DIAGNOSIS — Z1211 Encounter for screening for malignant neoplasm of colon: Secondary | ICD-10-CM

## 2018-05-26 MED ORDER — SODIUM CHLORIDE 0.9 % IV SOLN: 500.0000 mL | Freq: Once | INTRAVENOUS | Status: DC

## 2018-05-26 NOTE — Op Note (Signed)
[facility] Patient Name: Adrian Mcgee Procedure Date: 05/26/2018 10:13 AM MRN: [id number] Endoscopist: Justice Britain , MD Age: 52 Referring MD:  Date of Birth: 1966-04-24 Gender: Male Account #: [id number] Procedure:                Colonoscopy Indications:              Screening for colorectal malignant neoplasm Medicines:                Monitored Anesthesia Care Procedure:                Pre-Anesthesia Assessment:                           - Prior to the procedure, a History and Physical                            was performed, and patient medications and                            allergies were reviewed. The patient's tolerance of                            previous anesthesia was also reviewed. The risks                            and benefits of the procedure and the sedation                            options and risks were discussed with the patient.                            All questions were answered, and informed consent                            was obtained. Prior Anticoagulants: The patient has                            taken no previous anticoagulant or antiplatelet                            agents. ASA Grade Assessment: II - A patient with                            mild systemic disease. After reviewing the risks                            and benefits, the patient was deemed in                            satisfactory condition to undergo the procedure.                           After obtaining informed consent, the colonoscope  was passed under direct vision. Throughout the                            procedure, the patient's blood pressure, pulse, and                            oxygen saturations were monitored continuously. The                            Colonoscope was introduced through the anus and                            advanced to the 5 cm into the ileum. The                            colonoscopy was performed  without difficulty. The                            patient tolerated the procedure. The quality of the                            bowel preparation was evaluated using the BBPS                            [facility] Bowel Preparation Scale) with scores of:                            Right Colon = 3, Transverse Colon = 3 and Left                            Colon = 3 (entire mucosa seen well with no residual                            staining, small fragments of stool or opaque                            liquid). The total BBPS score equals 9. Scope In: 10:30:47 AM Scope Out: 10:45:58 AM Scope Withdrawal Time: 0 hours 12 minutes 33 seconds  Total Procedure Duration: 0 hours 15 minutes 11 seconds  Findings:                 The digital rectal exam findings include                            non-thrombosed internal hemorrhoids. Pertinent                            negatives include no palpable rectal lesions.                           The terminal ileum and ileocecal valve appeared                            normal.  Two sessile polyps were found in the ascending                            colon and cecum. The polyps were 1 to 4 mm in size.                            These polyps were removed with a cold snare.                            Resection and retrieval were complete.                           A few small-mouthed diverticula were found in the                            sigmoid colon.                           Normal mucosa was found in the entire colon                            otherwise.                           Non-bleeding non-thrombosed internal hemorrhoids                            were found during retroflexion, during perianal                            exam and during digital exam. The hemorrhoids were                            Grade II (internal hemorrhoids that prolapse but                            reduce spontaneously). Complications:             No immediate complications. Estimated Blood Loss:     Estimated blood loss was minimal. Impression:               - Non-thrombosed internal hemorrhoids found on                            digital rectal exam.                           - The examined portion of the ileum was normal.                           - Two 1 to 4 mm polyps in the ascending colon and                            in the cecum, removed with a cold snare. Resected  and retrieved.                           - Very few but small diverticulosis in the sigmoid                            colon.                           - Normal mucosa in the entire examined colon                            otherwise.                           - Non-bleeding non-thrombosed internal hemorrhoids. Recommendation:           - The patient will be observed post-procedure,                            until all discharge criteria are met.                           - Discharge patient to home.                           - Patient has a contact number available for                            emergencies. The signs and symptoms of potential                            delayed complications were discussed with the                            patient. Return to normal activities tomorrow.                            Written discharge instructions were provided to the                            patient.                           - High fiber diet.                           - Would begin fiber supplementation 1-2 times daily                            to aid in stool consistency and decrease risk of                            recurrent constipation and subsequent straining.                           - Await pathology results.                           -  Repeat colonoscopy in 5 years for surveillance                            based on pathology results.                           - The findings and recommendations were discussed                             with the patient.                           - The findings and recommendations were discussed                            with the patient's family. Justice Britain, MD 05/26/2018 10:53:45 AM

## 2018-05-26 NOTE — Progress Notes (Signed)
Called to room to assist during endoscopic procedure.  Patient ID and intended procedure confirmed with present staff. Received instructions for my participation in the procedure from the performing physician.  

## 2018-05-26 NOTE — Progress Notes (Signed)
PT taken to PACU. Monitors in place. VSS. Report given to RN. 

## 2018-05-26 NOTE — Progress Notes (Signed)
Pt's states no medical or surgical changes since previsit or office visit. 

## 2018-05-26 NOTE — Patient Instructions (Signed)
Thank you for allowing [place] to care for you today!  Await pathology results by mail, approximately 2 weeks.  Next colonoscopy recommendations will be made at that time.  High fiber diet.  Handout provided.  Begin fiber supplementation.  1-2 time daily.     YOU HAD AN ENDOSCOPIC PROCEDURE TODAY AT [facility]:   Refer to the procedure report that was given to you for any specific questions about what was found during the examination.  If the procedure report does not answer your questions, please call your gastroenterologist to clarify.  If you requested that your care partner not be given the details of your procedure findings, then the procedure report has been included in a sealed envelope for you to review at your convenience later.  YOU SHOULD EXPECT: Some feelings of bloating in the abdomen. Passage of more gas than usual.  Walking can help get rid of the air that was put into your GI tract during the procedure and reduce the bloating. If you had a lower endoscopy (such as a colonoscopy or flexible sigmoidoscopy) you [place] notice spotting of blood in your stool or on the toilet paper. If you underwent a bowel prep for your procedure, you [place] not have a normal bowel movement for a few days.  Please Note:  You might notice some irritation and congestion in your nose or some drainage.  This is from the oxygen used during your procedure.  There is no need for concern and it should clear up in a day or so.  SYMPTOMS TO REPORT IMMEDIATELY:   Following lower endoscopy (colonoscopy or flexible sigmoidoscopy):  Excessive amounts of blood in the stool  Significant tenderness or worsening of abdominal pains  Swelling of the abdomen that is new, acute  Fever of 100F or higher   Following upper endoscopy (EGD)  Vomiting of blood or coffee ground material  New chest pain or pain under the shoulder blades  Painful or persistently difficult swallowing  New shortness of  breath  Fever of 100F or higher  Black, tarry-looking stools  For urgent or emergent issues, a gastroenterologist can be reached at any hour by calling [phone].   DIET:  We do recommend a small meal at first, but then you [place] proceed to your regular diet.  Drink plenty of fluids but you should avoid alcoholic beverages for 24 hours.  ACTIVITY:  You should plan to take it easy for the rest of today and you should NOT DRIVE or use heavy machinery until tomorrow (because of the sedation medicines used during the test).    FOLLOW UP: Our staff will call the number listed on your records the next business day following your procedure to check on you and address any questions or concerns that you [place] have regarding the information given to you following your procedure. If we do not reach you, we will leave a message.  However, if you are feeling well and you are not experiencing any problems, there is no need to return our call.  We will assume that you have returned to your regular daily activities without incident.  If any biopsies were taken you will be contacted by phone or by letter within the next 1-3 weeks.  Please call us at [phone] if you have not heard about the biopsies in 3 weeks.    SIGNATURES/CONFIDENTIALITY: You and/or your care partner have signed paperwork which will be entered into your electronic medical record.  These  signatures attest to the fact that that the information above on your After Visit Summary has been reviewed and is understood.  Full responsibility of the confidentiality of this discharge information lies with you and/or your care-partner. 

## 2018-05-27 ENCOUNTER — Telehealth: Payer: Self-pay

## 2018-05-27 NOTE — Telephone Encounter (Signed)
  Follow up Call-  Call back number 05/26/2018  Post procedure Call Back phone  # [phone]  Permission to leave phone message Yes  Some recent data might be hidden     Patient questions:  Do you have a fever, pain , or abdominal swelling? No. Pain Score  0 *  Have you tolerated food without any problems? Yes.    Have you been able to return to your normal activities? Yes.    Do you have any questions about your discharge instructions: Diet   No. Medications  No. Follow up visit  No.  Do you have questions or concerns about your Care? No.  Actions: * If pain score is 4 or above: No action needed, pain <4.

## 2018-05-31 ENCOUNTER — Encounter: Payer: Self-pay | Admitting: Gastroenterology

## 2023-06-08 ENCOUNTER — Encounter: Payer: Self-pay | Admitting: Gastroenterology
